# Patient Record
Sex: Female | Born: 1995 | Race: Black or African American | Hispanic: No | Marital: Single | Smoking: Never smoker
Health system: Southern US, Community
[De-identification: ages and names within clinical notes are randomized; demographics above are authoritative.]

---

## 2017-02-25 ENCOUNTER — Other Ambulatory Visit: Payer: Self-pay

## 2017-02-25 ENCOUNTER — Emergency Department (HOSPITAL_COMMUNITY)
Admission: EM | Admit: 2017-02-25 | Discharge: 2017-02-25 | Disposition: A | Discharge status: Home / Self Care | Payer: BLUE CROSS/BLUE SHIELD | Attending: Emergency Medicine | Admitting: Emergency Medicine

## 2017-02-25 ENCOUNTER — Emergency Department (HOSPITAL_COMMUNITY): Payer: BLUE CROSS/BLUE SHIELD

## 2017-02-25 ENCOUNTER — Encounter (HOSPITAL_COMMUNITY): Payer: Self-pay

## 2017-02-25 DIAGNOSIS — Y999 Unspecified external cause status: Secondary | ICD-10-CM | POA: Insufficient documentation

## 2017-02-25 DIAGNOSIS — Y9389 Activity, other specified: Secondary | ICD-10-CM | POA: Diagnosis not present

## 2017-02-25 DIAGNOSIS — S82891A Other fracture of right lower leg, initial encounter for closed fracture: Secondary | ICD-10-CM

## 2017-02-25 DIAGNOSIS — Y929 Unspecified place or not applicable: Secondary | ICD-10-CM | POA: Diagnosis not present

## 2017-02-25 DIAGNOSIS — S92151A Displaced avulsion fracture (chip fracture) of right talus, initial encounter for closed fracture: Secondary | ICD-10-CM | POA: Insufficient documentation

## 2017-02-25 DIAGNOSIS — S93401A Sprain of unspecified ligament of right ankle, initial encounter: Secondary | ICD-10-CM

## 2017-02-25 DIAGNOSIS — W010XXA Fall on same level from slipping, tripping and stumbling without subsequent striking against object, initial encounter: Secondary | ICD-10-CM | POA: Diagnosis not present

## 2017-02-25 DIAGNOSIS — S99911A Unspecified injury of right ankle, initial encounter: Secondary | ICD-10-CM | POA: Diagnosis present

## 2017-02-25 MED ORDER — IBUPROFEN 400 MG PO TABS
600.0000 mg | ORAL_TABLET | Freq: Once | ORAL | Status: AC
  Administered 2017-02-25: 600 mg via ORAL
  Filled 2017-02-25: qty 1

## 2017-02-25 NOTE — ED Notes (Signed)
Ortho tech applying devices.

## 2017-02-25 NOTE — ED Notes (Signed)
CAM walker and crutches applied by ortho tech with return demonstration

## 2017-02-25 NOTE — Discharge Instructions (Signed)
Use ibuprofen and Tylenol for pain, elevate leg and use ice to help with swelling.  Please remain in cam walker boot and use crutches.  Call Dr. Kathline MagicSwinteck's office to schedule follow-up appointment with orthopedics.  If you have significantly worsening pain over the joint, redness, warmth, fevers or chills, or develop numbness or tingling please return to the ED for reevaluation.

## 2017-02-25 NOTE — ED Triage Notes (Signed)
Pt endorses slipping on ice this morning causing right ankle injury. Swelling noted, cms intact. VSS.

## 2017-02-25 NOTE — Progress Notes (Signed)
Orthopedic Tech Progress Note Patient Details:  Leslie Williams 05/10/1995 [id number]  Ortho Devices Type of Ortho Device: CAM walker, Crutches Ortho Device/Splint Location: Right Ortho Device/Splint Interventions: Application, Adjustment   Post Interventions Patient Tolerated: Well, Ambulated well Instructions Provided: Adjustment of device, Care of device, Poper ambulation with device   Alvina ChouWilliams, Eliodoro Gullett C 02/25/2017, 2:34 PM

## 2017-02-25 NOTE — ED Provider Notes (Signed)
[facility]   CSN: [id number] Arrival date & time: 02/25/17  1100     History   Chief Complaint Chief Complaint  Patient presents with  . Ankle Pain    HPI Leslie Williams is a 22 y.o. female.  Leslie Williams is a 22 y.o. Female who is otherwise healthy, presents to the ED for evaluation of right ankle pain after slipping on ice this morning.  Patient reports she fell and twisted her ankle, denies other injuries, did not hit her head no loss of consciousness.  Patient complaining of pain and swelling over the right ankle, primarily over the lateral aspect the patient also complaining of some pain over the medial aspect.  Patient has not tried weightbearing, extremely painful with any movements.  Denies numbness or tingling.  No pain in the lower leg or knee.  No meds prior to arrival.  No history of previous injuries or fractures to that ankle.      History reviewed. No pertinent past medical history.  There are no active problems to display for this patient.   History reviewed. No pertinent surgical history.  OB History    No data available       Home Medications    Prior to Admission medications   Not on File    Family History History reviewed. No pertinent family history.  Social History Social History   Tobacco Use  . Smoking status: Never Smoker  Substance Use Topics  . Alcohol use: No    Frequency: Never  . Drug use: No     Allergies   Patient has no known allergies.   Review of Systems Review of Systems  Constitutional: Negative for chills and fever.  Musculoskeletal: Positive for arthralgias (R ankle) and joint swelling.  Skin: Negative for wound.  Neurological: Negative for weakness and numbness.     Physical Exam Updated Vital Signs BP 112/87   Pulse 80   Temp 98.1 F (36.7 C)   Resp 16   Ht 5\' 4"  (1.626 m)   Wt 65.8 kg (145 lb)   LMP 02/01/2017 (Approximate)    SpO2 100%   BMI 24.89 kg/m   Physical Exam  Constitutional: She appears well-developed and well-nourished. No distress.  HENT:  Head: Normocephalic and atraumatic.  Eyes: Right eye exhibits no discharge. Left eye exhibits no discharge.  Pulmonary/Chest: Effort normal. No respiratory distress.  Musculoskeletal:  Obvious swelling noted over the lateral malleolus, with tenderness to palpation, no erythema or warmth, no obvious bony deformity, no wounds, there is also point tenderness over the medial malleolus, no tenderness over the calcaneus or calf.  2+ DP and TP pulses with good cap refill, sensation intact, dorsiflexion and plantar flexion limited by pain. Normal knee exam  Neurological: She is alert. Coordination normal.  Skin: She is not diaphoretic.  Psychiatric: She has a normal mood and affect. Her behavior is normal.  Nursing note and vitals reviewed.    ED Treatments / Results  Labs (all labs ordered are listed, but only abnormal results are displayed) Labs Reviewed - No data to display  EKG  EKG Interpretation None       Radiology Dg Ankle Complete Right  Result Date: 02/25/2017 CLINICAL DATA:  Recent slip and fall with right ankle pain, initial encounter EXAM: RIGHT ANKLE - COMPLETE 3+ VIEW COMPARISON:  None. FINDINGS: Tiny bony densities noted adjacent to the medial malleolus which is likely related to prior injury. No  donor site is seen. No other fracture is noted. Correlation to point tenderness is recommended. No soft tissue abnormality is seen. IMPRESSION: Small bony densities adjacent to the medial malleolus likely related to prior trauma. Correlation to point tenderness is recommended. Electronically Signed   By: Alcide Clever M.D.   On: 02/25/2017 11:38    Procedures Procedures (including critical care time)  Medications Ordered in ED Medications  ibuprofen (ADVIL,MOTRIN) tablet 600 mg (600 mg Oral Given 02/25/17 1233)     Initial Impression / Assessment  and Plan / ED Course  I have reviewed the triage vital signs and the nursing notes.  Pertinent labs & imaging results that were available during my care of the patient were reviewed by me and considered in my medical decision making (see chart for details).  Patient presents for evaluation of right ankle pain after she slipped and fell on ice this morning.  Right lower extremity is neurovascularly intact, there is swelling and tenderness over the lateral malleolus, as well as some point tenderness over the medial malleolus.  X-ray shows some small bony densities adjacent to the medial malleolus, that could be related to prior trauma, discussed this with patient no previous fractures or injuries to this ankle and she does have point tenderness in this area, which could represent a small avulsion fracture of the medial malleolus.  Presentation also suggestive of ankle sprain.  Will place patient in cam walker boot and crutches and have patient follow-up with orthopedics.  Discussed ice elevation, Tylenol and ibuprofen for pain.  Patient expresses understanding and is agreement with plan.  Return precautions discussed.  Final Clinical Impressions(s) / ED Diagnoses   Final diagnoses:  Sprain of right ankle, unspecified ligament, initial encounter  Closed avulsion fracture of right ankle, initial encounter    ED Discharge Orders    None       Dartha Lodge, [place] 02/25/17 1457    Bethann Berkshire, MD 02/25/17 1553

## 2018-06-29 IMAGING — DX DG ANKLE COMPLETE 3+V*R*
3 series · 3 of 3 positions shown · non-contrast
Comparison: None.

CLINICAL DATA: Recent slip and fall with right ankle pain, initial
encounter

EXAM:
RIGHT ANKLE - COMPLETE 3+ VIEW

[x ankle ap right]
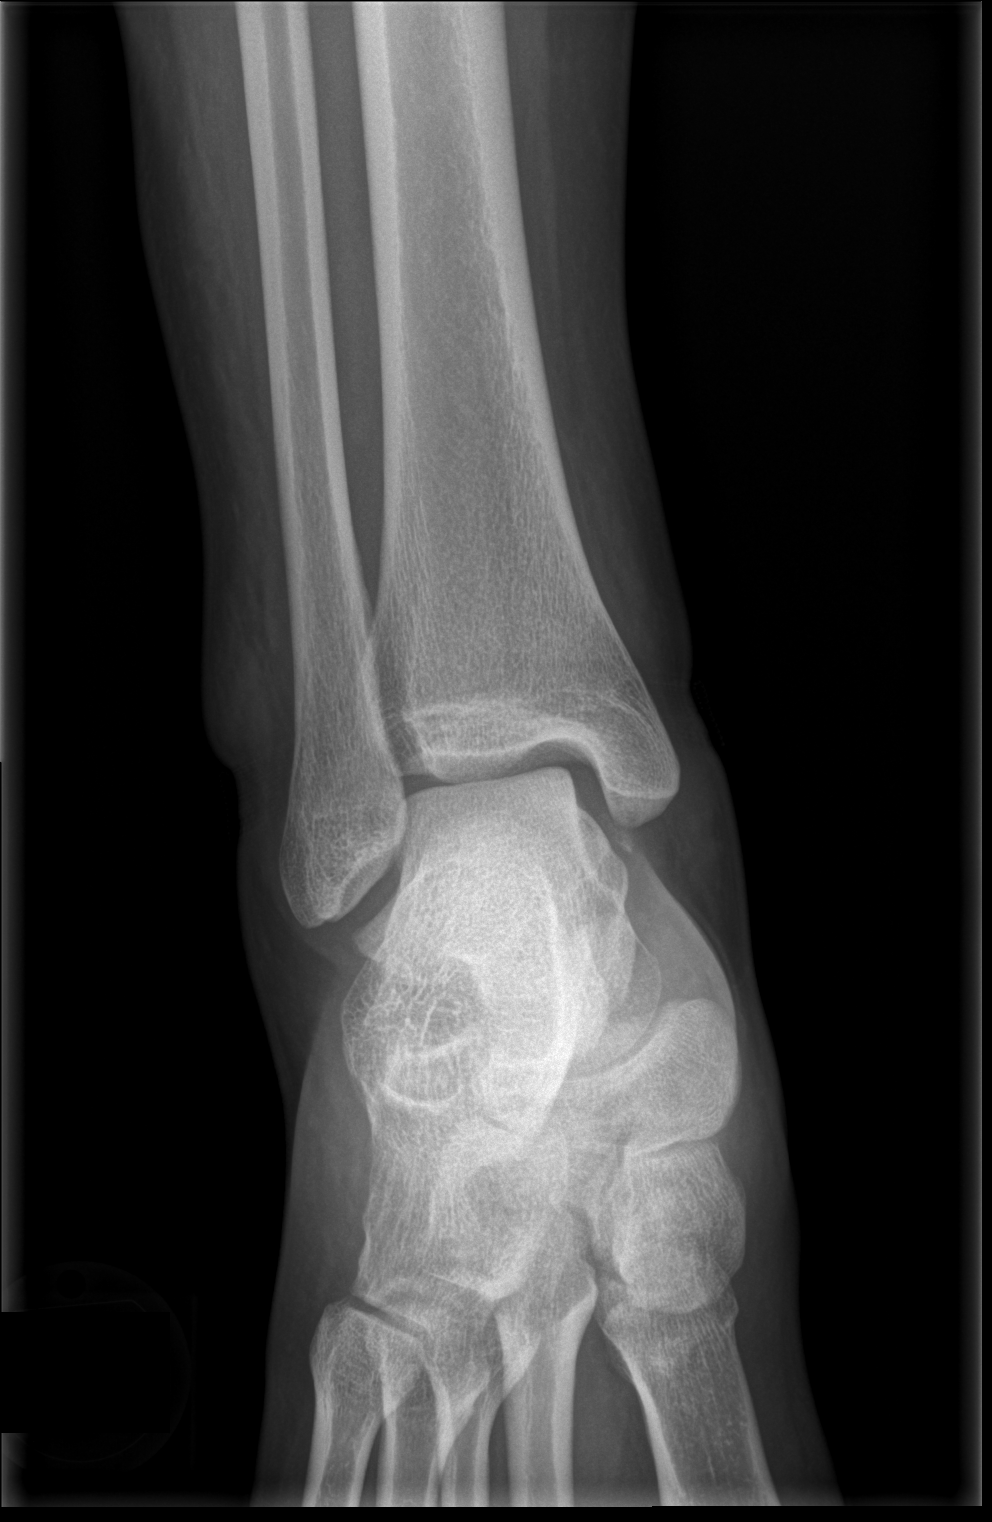

[x ankle obl right]
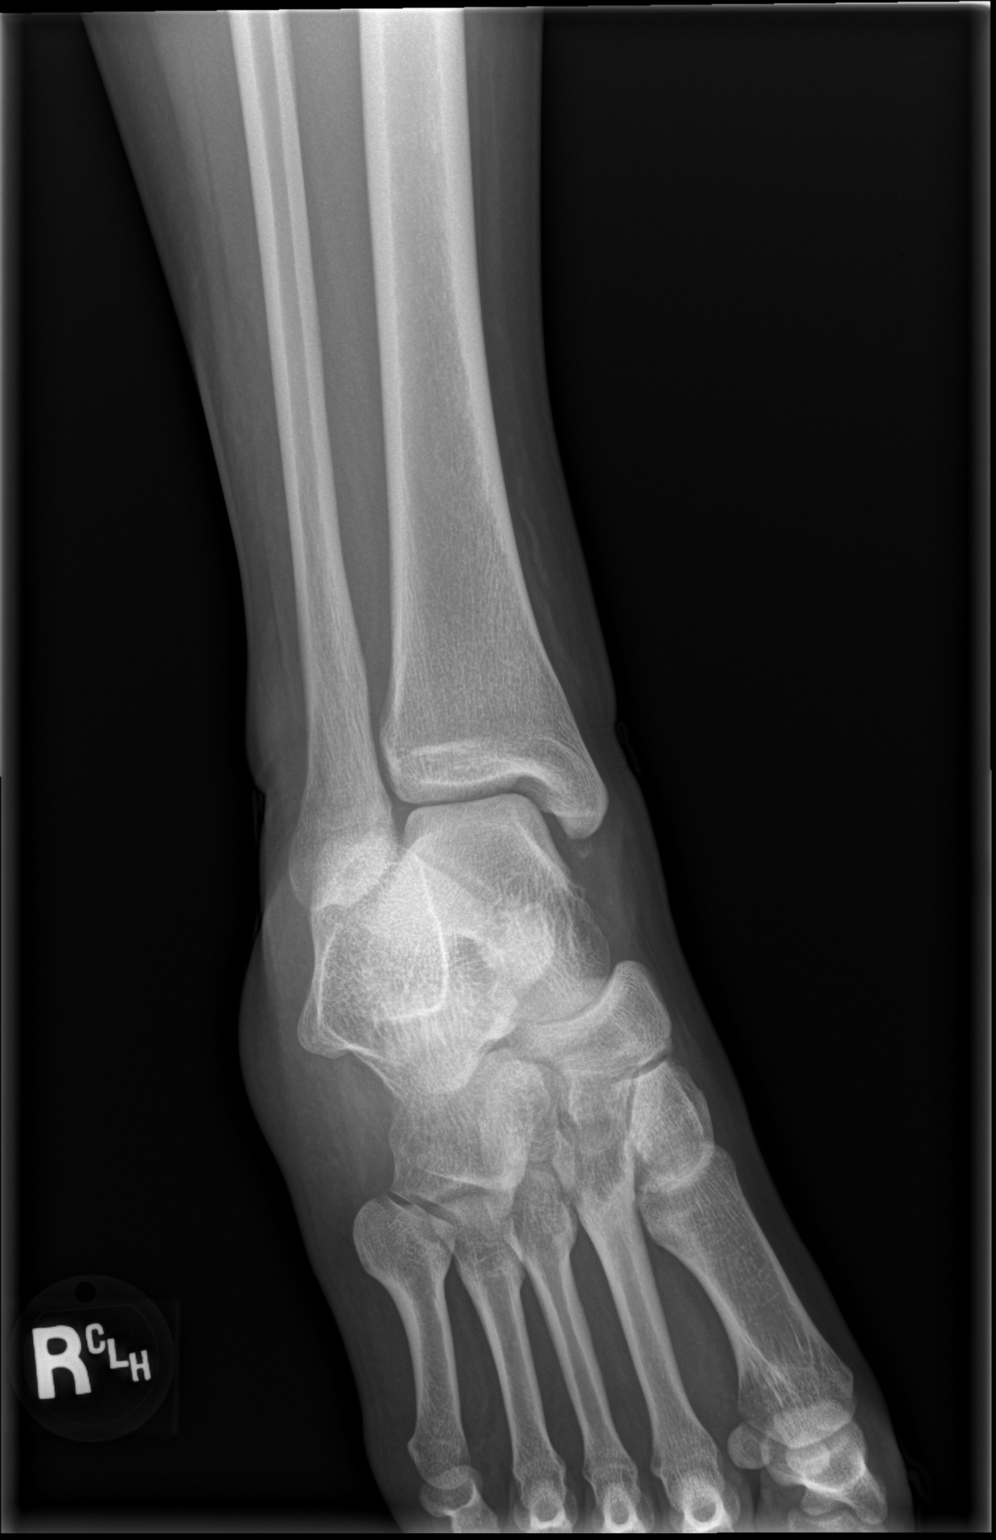

[x ankle lat right]
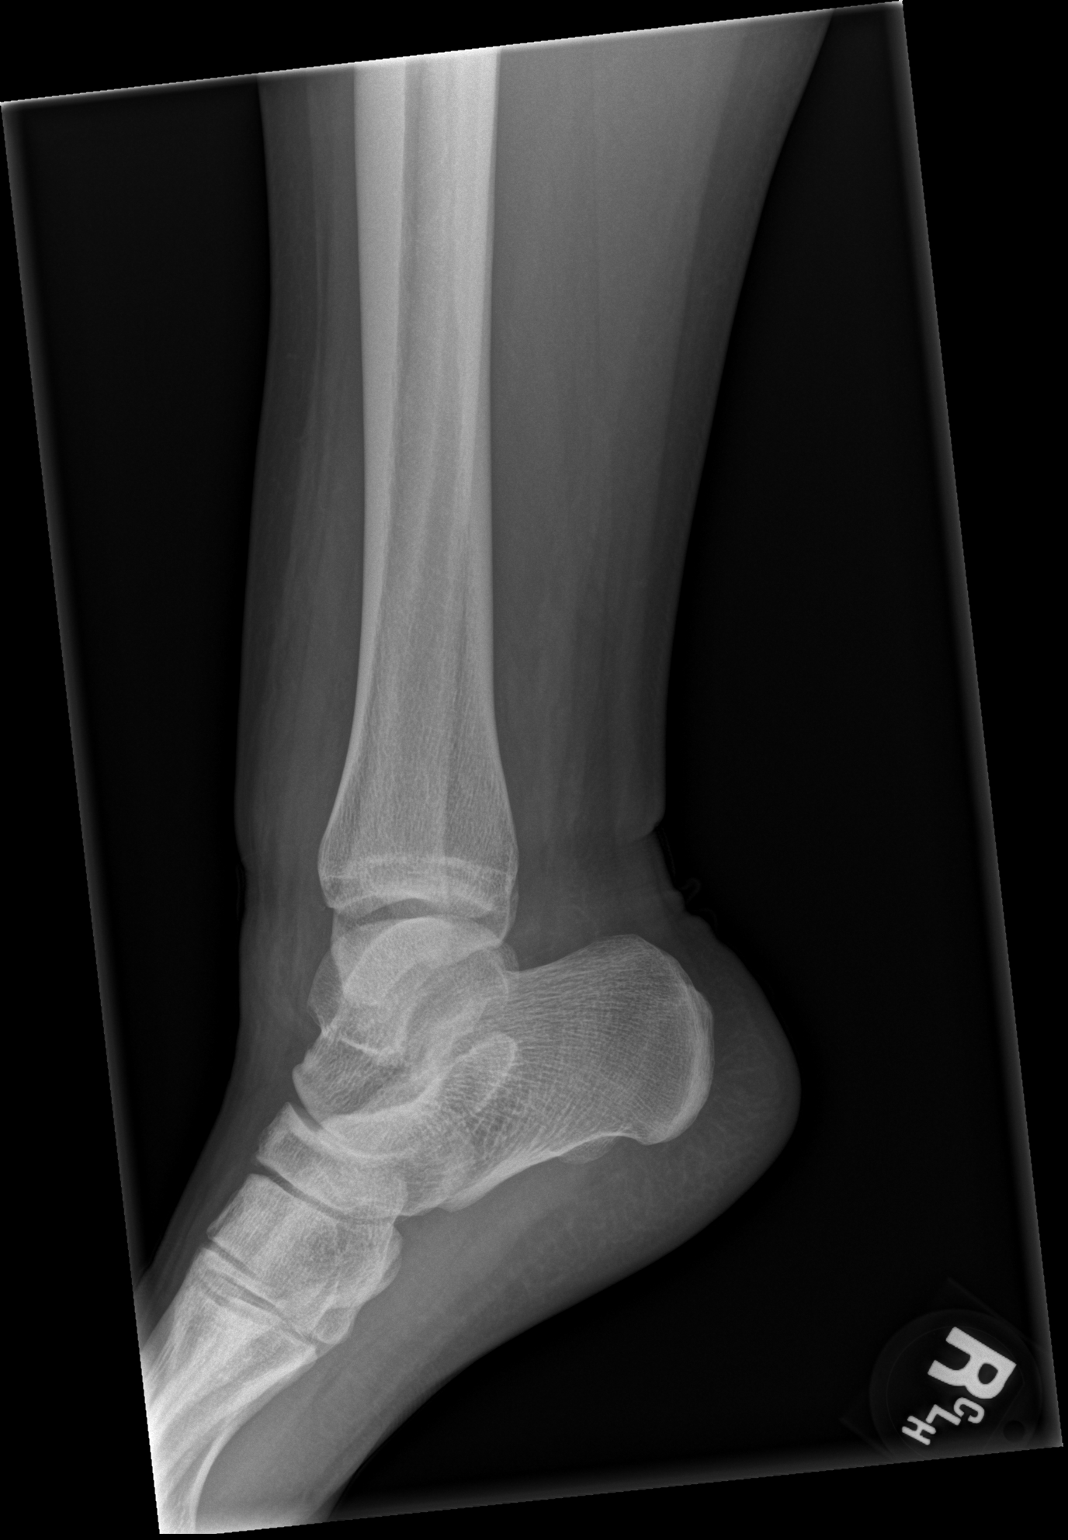

[3 of 3 positions shown; findings below may reference images not displayed]

FINDINGS: Tiny bony densities noted adjacent to the medial malleolus which is
likely related to prior injury. No donor site is seen. No other
fracture is noted. Correlation to point tenderness is recommended.
No soft tissue abnormality is seen.
IMPRESSION: Small bony densities adjacent to the medial malleolus likely related
to prior trauma. Correlation to point tenderness is recommended.
